# Patient Record
Sex: Female | Born: 1935 | Race: White | Hispanic: No | Marital: Married
Health system: Southern US, Community
[De-identification: ages and names within clinical notes are randomized; demographics above are authoritative.]

---

## 2011-02-09 ENCOUNTER — Emergency Department: Payer: Self-pay | Admitting: Emergency Medicine

## 2011-02-09 LAB — URINALYSIS, COMPLETE
Bacteria: NONE SEEN
Bilirubin,UR: NEGATIVE
Blood: NEGATIVE
Glucose,UR: NEGATIVE mg/dL (ref 0–75)
Nitrite: NEGATIVE
Specific Gravity: 1.021 (ref 1.003–1.030)
Squamous Epithelial: <1

## 2011-02-09 LAB — COMPREHENSIVE METABOLIC PANEL
Albumin: 3.1 g/dL — ABNORMAL LOW (ref 3.4–5.0)
Alkaline Phosphatase: 40 U/L — ABNORMAL LOW (ref 50–136)
Anion Gap: 9 (ref 7–16)
BUN: 11 mg/dL (ref 7–18)
Bilirubin,Total: 0.6 mg/dL (ref 0.2–1.0)
Calcium, Total: 8.6 mg/dL (ref 8.5–10.1)
Co2: 28 mmol/L (ref 21–32)
Creatinine: 0.59 mg/dL — ABNORMAL LOW (ref 0.60–1.30)
Glucose: 101 mg/dL — ABNORMAL HIGH (ref 65–99)
Osmolality: 292 (ref 275–301)
Potassium: 3.9 mmol/L (ref 3.5–5.1)
Sodium: 147 mmol/L — ABNORMAL HIGH (ref 136–145)
Total Protein: 6 g/dL — ABNORMAL LOW (ref 6.4–8.2)

## 2011-02-09 LAB — CBC
HGB: 13.4 g/dL (ref 12.0–16.0)
MCHC: 34 g/dL (ref 32.0–36.0)
Platelet: 120 10*3/uL — ABNORMAL LOW (ref 150–440)
WBC: 6.6 10*3/uL (ref 3.6–11.0)

## 2011-07-12 LAB — URINALYSIS, COMPLETE
Glucose,UR: NEGATIVE mg/dL (ref 0–75)
Protein: NEGATIVE
RBC,UR: 1 /HPF (ref 0–5)
Specific Gravity: 1.019 (ref 1.003–1.030)
Squamous Epithelial: NONE SEEN
WBC UR: 1 /HPF (ref 0–5)

## 2011-07-12 LAB — CBC
HCT: 38.7 % (ref 35.0–47.0)
MCH: 33.7 pg (ref 26.0–34.0)
MCHC: 33.2 g/dL (ref 32.0–36.0)
MCV: 102 fL — ABNORMAL HIGH (ref 80–100)
WBC: 8 10*3/uL (ref 3.6–11.0)

## 2011-07-12 LAB — COMPREHENSIVE METABOLIC PANEL
Albumin: 3 g/dL — ABNORMAL LOW (ref 3.4–5.0)
Anion Gap: 10 (ref 7–16)
Bilirubin,Total: 0.4 mg/dL (ref 0.2–1.0)
Co2: 27 mmol/L (ref 21–32)
EGFR (African American): >60
EGFR (Non-African Amer.): >60
Glucose: 111 mg/dL — ABNORMAL HIGH (ref 65–99)
Potassium: 4.2 mmol/L (ref 3.5–5.1)
SGPT (ALT): 17 U/L
Sodium: 145 mmol/L (ref 136–145)
Total Protein: 5.7 g/dL — ABNORMAL LOW (ref 6.4–8.2)

## 2011-07-12 LAB — TROPONIN I: Troponin-I: 0.02 ng/mL

## 2011-07-13 ENCOUNTER — Observation Stay: Payer: Self-pay | Admitting: Internal Medicine

## 2011-07-15 LAB — BASIC METABOLIC PANEL
Anion Gap: 5 — ABNORMAL LOW (ref 7–16)
BUN: 11 mg/dL (ref 7–18)
Co2: 31 mmol/L (ref 21–32)
Creatinine: 0.63 mg/dL (ref 0.60–1.30)
EGFR (Non-African Amer.): >60
Glucose: 90 mg/dL (ref 65–99)
Sodium: 145 mmol/L (ref 136–145)

## 2011-07-15 LAB — CBC WITH DIFFERENTIAL/PLATELET
Basophil %: 0.3 %
Eosinophil #: 0.1 10*3/uL (ref 0.0–0.7)
Eosinophil %: 2.2 %
HCT: 36.6 % (ref 35.0–47.0)
Lymphocyte #: 1.6 10*3/uL (ref 1.0–3.6)
MCH: 33.5 pg (ref 26.0–34.0)
MCHC: 32.9 g/dL (ref 32.0–36.0)
Monocyte #: 0.5 x10 3/mm (ref 0.2–0.9)
Monocyte %: 9.1 %
Neutrophil #: 3.1 10*3/uL (ref 1.4–6.5)
Platelet: 105 10*3/uL — ABNORMAL LOW (ref 150–440)
RDW: 13.4 % (ref 11.5–14.5)
WBC: 5.3 10*3/uL (ref 3.6–11.0)

## 2012-05-01 LAB — COMPREHENSIVE METABOLIC PANEL
Anion Gap: 5 — ABNORMAL LOW (ref 7–16)
Chloride: 109 mmol/L — ABNORMAL HIGH (ref 98–107)
Creatinine: 0.63 mg/dL (ref 0.60–1.30)
Glucose: 114 mg/dL — ABNORMAL HIGH (ref 65–99)
Potassium: 3.9 mmol/L (ref 3.5–5.1)
Total Protein: 6.7 g/dL (ref 6.4–8.2)

## 2012-05-01 LAB — CK TOTAL AND CKMB (NOT AT ARMC): CK-MB: 1.2 ng/mL (ref 0.5–3.6)

## 2012-05-01 LAB — CBC
HCT: 41.9 % (ref 35.0–47.0)
MCH: 32.5 pg (ref 26.0–34.0)
RBC: 4.26 10*6/uL (ref 3.80–5.20)

## 2012-05-01 LAB — TROPONIN I: Troponin-I: <0.02 ng/mL

## 2012-05-02 ENCOUNTER — Observation Stay: Payer: Self-pay | Admitting: Internal Medicine

## 2012-05-02 LAB — URINALYSIS, COMPLETE
Bacteria: NONE SEEN
Bilirubin,UR: NEGATIVE
Blood: NEGATIVE
Ph: 6 (ref 4.5–8.0)
Squamous Epithelial: <1
WBC UR: 2 /HPF (ref 0–5)

## 2012-05-02 LAB — TROPONIN I: Troponin-I: <0.02 ng/mL

## 2012-05-03 LAB — URINE CULTURE

## 2012-05-04 LAB — CBC WITH DIFFERENTIAL/PLATELET
Eosinophil #: 0.2 10*3/uL (ref 0.0–0.7)
HCT: 36 % (ref 35.0–47.0)
MCH: 33.1 pg (ref 26.0–34.0)
MCHC: 33.7 g/dL (ref 32.0–36.0)
Monocyte #: 0.6 x10 3/mm (ref 0.2–0.9)
Neutrophil %: 52.3 %
Platelet: 112 10*3/uL — ABNORMAL LOW (ref 150–440)
RBC: 3.66 10*6/uL — ABNORMAL LOW (ref 3.80–5.20)

## 2012-05-05 LAB — CBC WITH DIFFERENTIAL/PLATELET
Eosinophil #: 0.2 10*3/uL (ref 0.0–0.7)
Eosinophil %: 2.8 %
HCT: 35.9 % (ref 35.0–47.0)
HGB: 12 g/dL (ref 12.0–16.0)
Lymphocyte #: 1.7 10*3/uL (ref 1.0–3.6)
MCV: 99 fL (ref 80–100)
Monocyte #: 0.6 x10 3/mm (ref 0.2–0.9)
Monocyte %: 10.7 %
Neutrophil %: 55.3 %
Platelet: 114 10*3/uL — ABNORMAL LOW (ref 150–440)
RBC: 3.64 10*6/uL — ABNORMAL LOW (ref 3.80–5.20)
WBC: 5.6 10*3/uL (ref 3.6–11.0)

## 2012-05-05 LAB — BASIC METABOLIC PANEL
Anion Gap: 4 — ABNORMAL LOW (ref 7–16)
BUN: 11 mg/dL (ref 7–18)
Calcium, Total: 8.3 mg/dL — ABNORMAL LOW (ref 8.5–10.1)
Chloride: 111 mmol/L — ABNORMAL HIGH (ref 98–107)
Co2: 29 mmol/L (ref 21–32)
Creatinine: 0.78 mg/dL (ref 0.60–1.30)
Glucose: 108 mg/dL — ABNORMAL HIGH (ref 65–99)
Sodium: 144 mmol/L (ref 136–145)

## 2012-06-25 ENCOUNTER — Encounter: Payer: Self-pay | Admitting: Cardiothoracic Surgery

## 2012-06-25 ENCOUNTER — Encounter: Payer: Self-pay | Admitting: Nurse Practitioner

## 2012-06-28 ENCOUNTER — Encounter: Payer: Self-pay | Admitting: Nurse Practitioner

## 2012-06-28 ENCOUNTER — Encounter: Payer: Self-pay | Admitting: Cardiothoracic Surgery

## 2012-07-19 ENCOUNTER — Emergency Department: Payer: Self-pay | Admitting: Emergency Medicine

## 2012-07-19 LAB — BASIC METABOLIC PANEL
Chloride: 103 mmol/L (ref 98–107)
Co2: 32 mmol/L (ref 21–32)
EGFR (Non-African Amer.): 40 — ABNORMAL LOW
Glucose: 166 mg/dL — ABNORMAL HIGH (ref 65–99)
Osmolality: 285 (ref 275–301)
Sodium: 141 mmol/L (ref 136–145)

## 2012-07-19 LAB — CBC
HGB: 12.7 g/dL (ref 12.0–16.0)
MCH: 32.5 pg (ref 26.0–34.0)
MCHC: 33.4 g/dL (ref 32.0–36.0)
MCV: 97 fL (ref 80–100)
Platelet: 145 10*3/uL — ABNORMAL LOW (ref 150–440)
RDW: 13.3 % (ref 11.5–14.5)

## 2012-07-21 LAB — WOUND CULTURE

## 2012-07-28 ENCOUNTER — Encounter: Payer: Self-pay | Admitting: Cardiothoracic Surgery

## 2012-07-28 ENCOUNTER — Encounter: Payer: Self-pay | Admitting: Nurse Practitioner

## 2012-11-28 DEATH — deceased

## 2013-09-30 IMAGING — CT CT HEAD WITHOUT CONTRAST
2 series · 15 of 30 positions shown, 19 images · non-contrast
Comparison: none

REASON FOR EXAM: ams
COMMENTS:

[Series 2: without · axial · non-contrast · 0.42mm/px · z∈[-130,-10]mm · 13 of 29 slices shown, 17 images]
[im 3/29  brain]
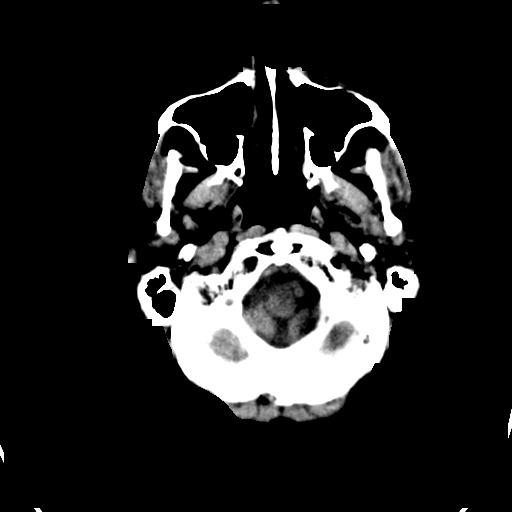
[im 3/29  bone]
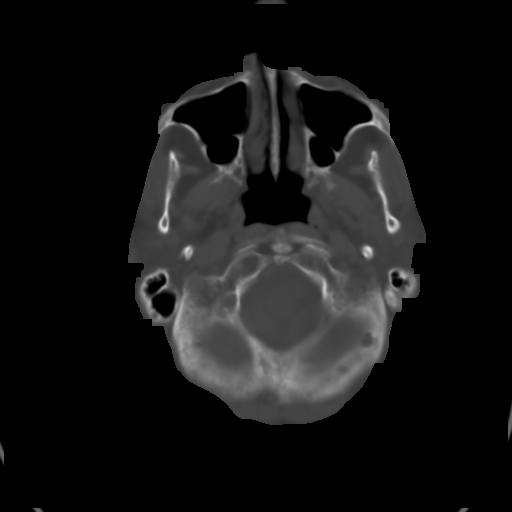
[im 5/29  brain]
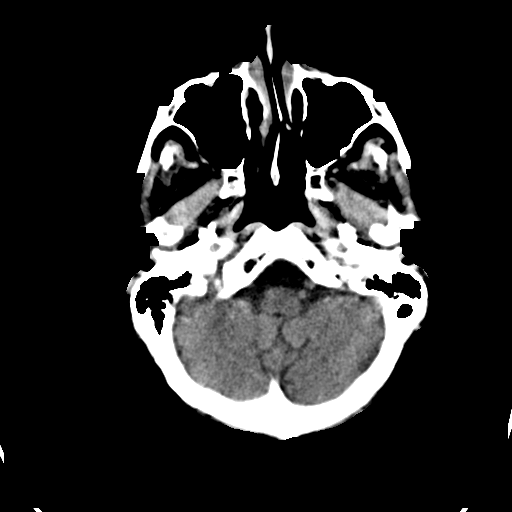
[im 7/29  brain]
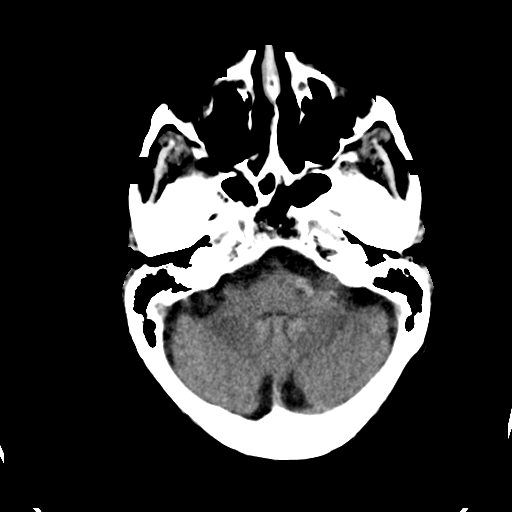
[im 9/29  brain]
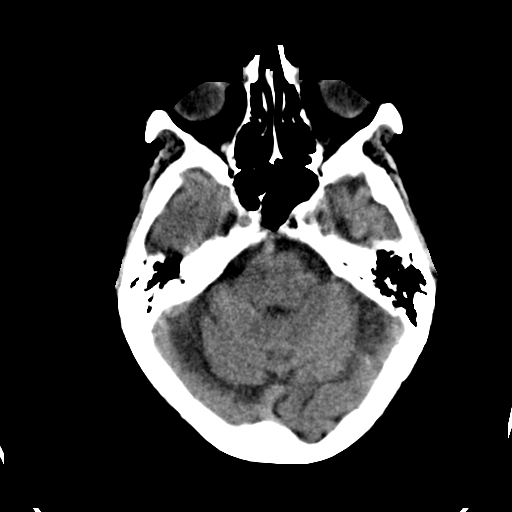
[im 11/29  brain]
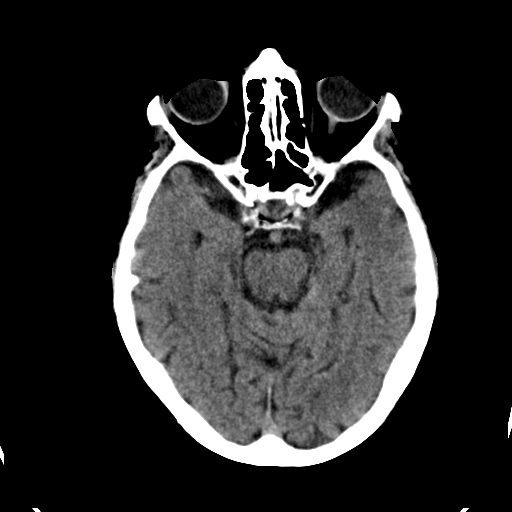
[im 11/29  bone]
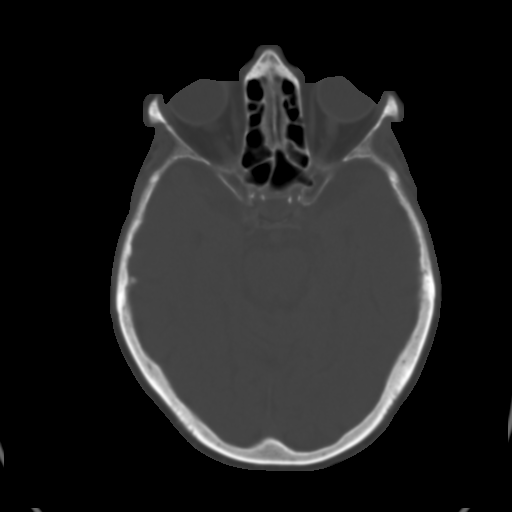
[im 13/29  brain]
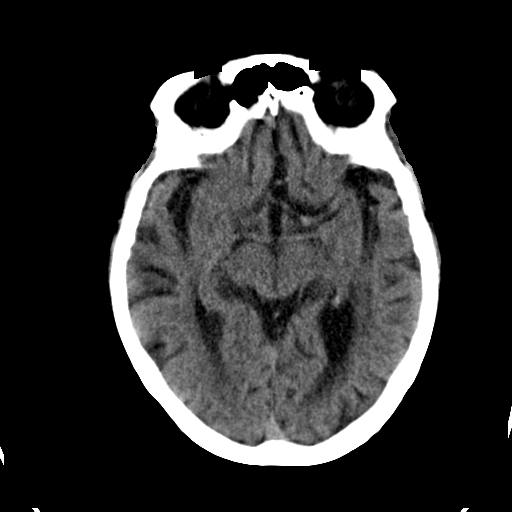
[im 15/29  brain]
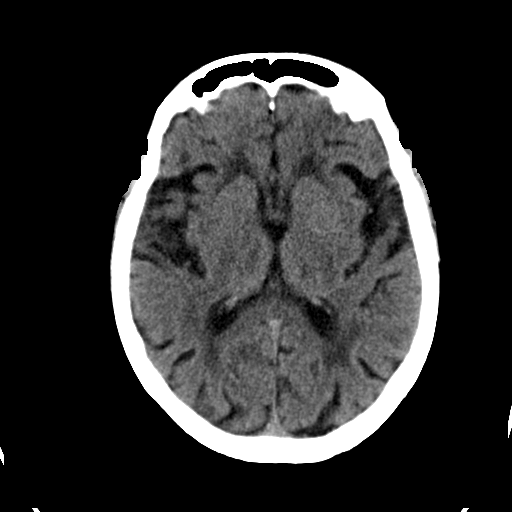
[im 17/29  brain]
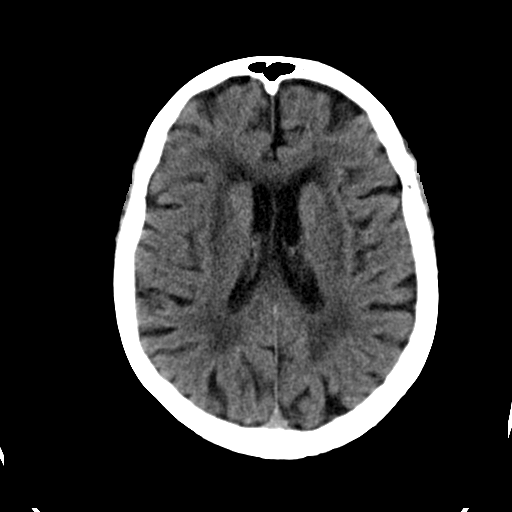
[im 19/29  brain]
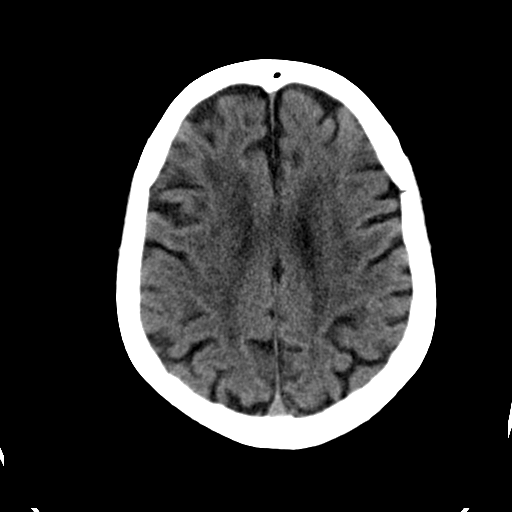
[im 19/29  bone]
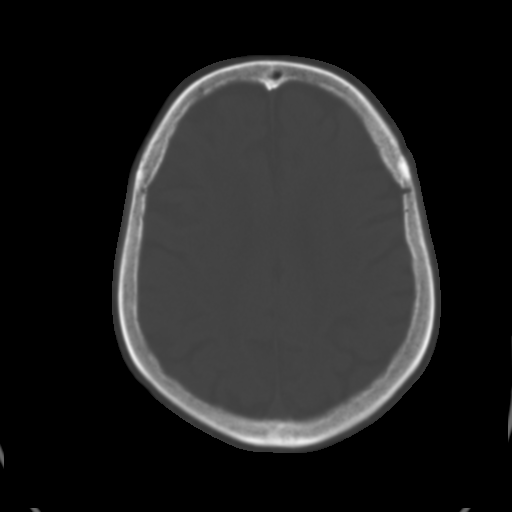
[im 21/29  brain]
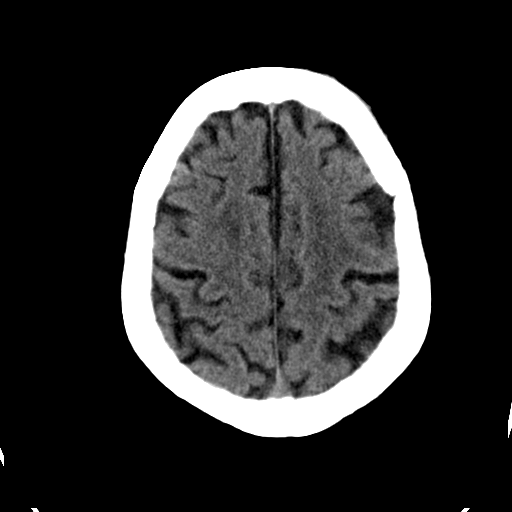
[im 23/29  brain]
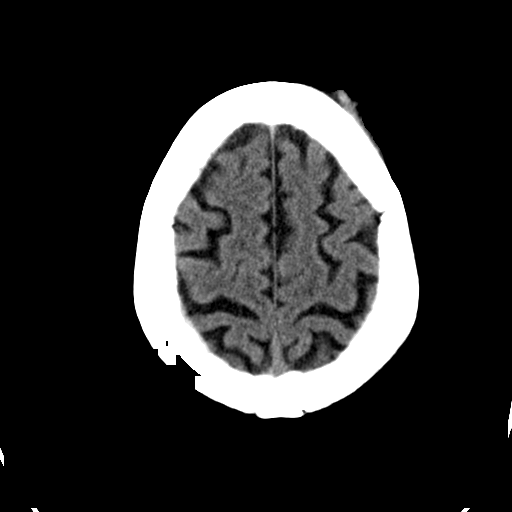
[im 25/29  brain]
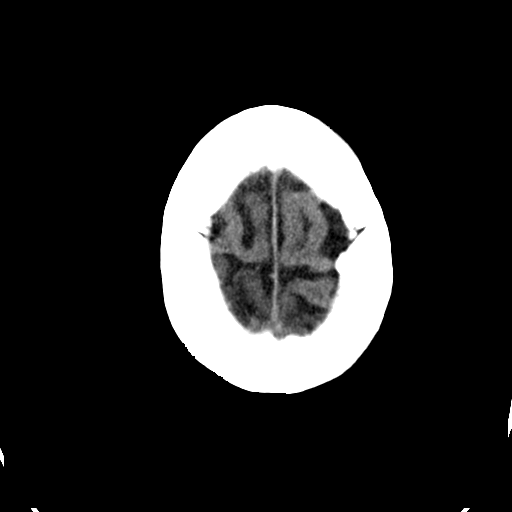
[im 27/29  brain]
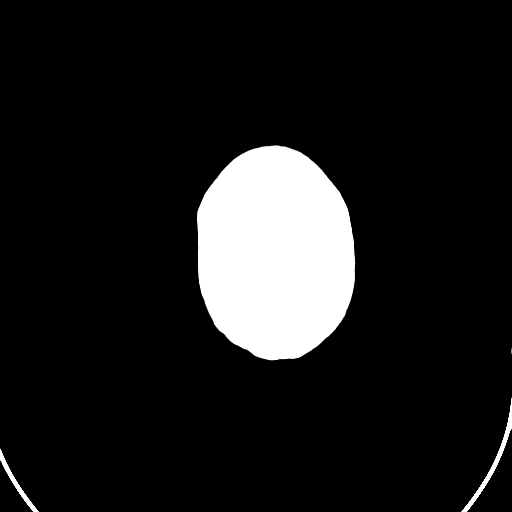
[im 27/29  bone]
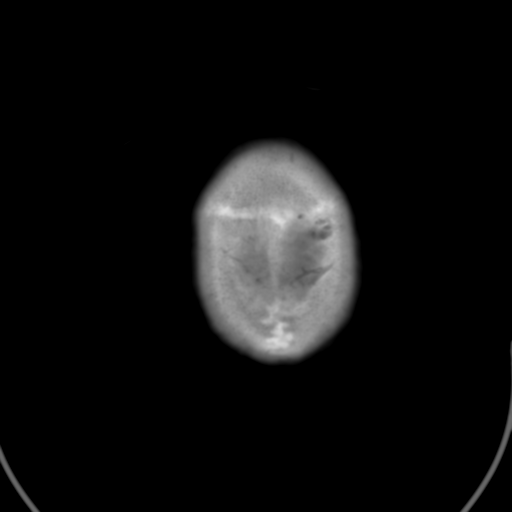

[Series 3: bone · axial · 0.42mm/px · z∈[-130,-110]mm · 2 of 29 slices shown]
[im 3/29  bone]
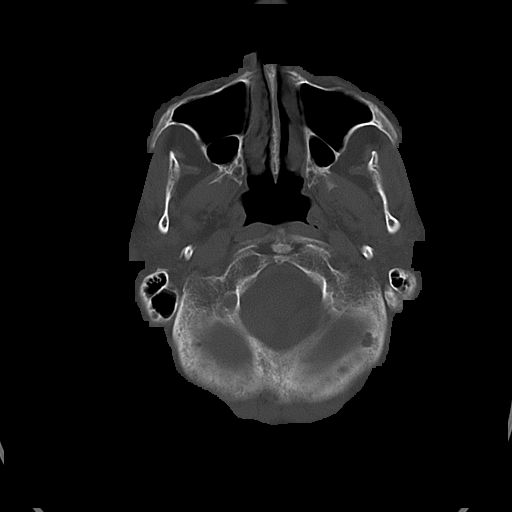
[im 7/29  bone]
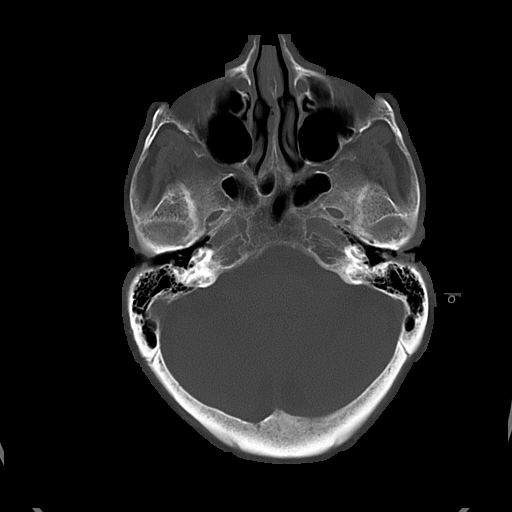

[15 of 30 positions shown; findings below may reference images not displayed]

PROCEDURE:     CT  - CT HEAD WITHOUT CONTRAST  - July 12, 2011  [DATE]

RESULT:     Axial noncontrast CT scanning was performed through the brain
with reconstructions at 5 mm intervals and slice thicknesses. There are no
previous studies for comparison.

There is moderate diffuse cerebral and cerebellar atrophy consistent with
the patient's age. There is no shift of the midline. There is no
ventriculomegaly. There is no evidence of an acute intracranial hemorrhage.
Decreased density in the deep white matter of both cerebral hemispheres is
symmetric and consistent with chronic small vessel ischemic type change. At
bone window settings the observed portions of the paranasal sinuses and
mastoid air cells are clear. There is a cephalohematoma and laceration over
the left frontal region. There is no underlying skull fracture.
IMPRESSION: 1. There are age-related atrophic changes diffusely.
2. I see no evidence of an acute intracranial hemorrhage nor of an evolving
ischemic infarction.
3. There is soft tissue injury over the left frontal bone but I see no
underlying fracture.

[REDACTED]

## 2014-05-20 NOTE — H&P (Signed)
PATIENT NAME:  Briana RoyalsWARD, Briana Moore MR#:  [id number] DATE OF BIRTH:  [birth year]  DATE OF ADMISSION:  05/02/2012  PRIMARY CARE PHYSICIAN:  Dr. Einar CrowMarshall Anderson.   REFERRING PHYSICIAN:  Dr. Dorothea GlassmanPaul Malinda.   CHIEF COMPLAINT:  Shortness of breath.   HISTORY OF PRESENT ILLNESS:  The patient is a 81 year old Caucasian female, was in her usual state of health until the last few hours prior to presentation to the Emergency Department.  She was noticed to have little dry cough, but her concern is that she feels short of breath.  She cannot take a good deep breath.  Denies any chest pain.  No syncope.  No palpitations.  No vomiting.  No diarrhea.  No abdominal pain.  The patient was evaluated at the Emergency Department.  Her chest x-ray was unremarkable except for some chronic changes.  She ended by having CTA of the chest which was negative for pulmonary embolism, however there was a question about collateral veins that are filling and they are within the superior mediastinum.  There was suggestion of extrinsic impression on the left brachiocephalic vein with a questionable if there is a presence of intraluminal thrombus within the superior vena cava that was not excluded.  There was also mild hilar and mediastinal lymphadenopathy.  There was also evidence of chronic interstitial lung disease.  Also noted some mild peribronchial thickening probably reflecting mild bronchitis.  The patient was now in the process to be admitted for further evaluation as an observation.   REVIEW OF SYSTEMS:  CONSTITUTIONAL:  Denies having any fever.  No chills, but she has mild generalized weakness.  EYES:  No blurring of vision.  No double vision.  EARS, NOSE, THROAT:  She has mild hearing impairment that is bilateral and chronic.  No sore throat.  No dysphagia.  CARDIOVASCULAR:  No chest pain, but she has shortness of breath the last few hours.  Mild peripheral edema.  No syncope.  RESPIRATORY:  Mild dry cough.  No hemoptysis.  No  chest pain.  She has shortness of breath as above.  GASTROINTESTINAL:  No abdominal pain, no vomiting, no diarrhea.  GENITOURINARY:  No dysuria, however the family noted that since she was here in the Emergency Department she is urinating more than usual.  The patient denies any dysuria.  MUSCULOSKELETAL:  No joint pain or swelling.  No muscular pain or swelling.  INTEGUMENTARY:  No skin rash.  No ulcers.  NEUROLOGY:  No focal weakness.  No seizure activity.  No headache.  PSYCHIATRY:  No anxiety.  No depression.  ENDOCRINE:  No polyuria or polydipsia.  No heat or cold intolerance.   PAST MEDICAL HISTORY:  Osteoporosis and mild hyperlipidemia.   PAST SURGICAL HISTORY:  Cholecystectomy.   FAMILY HISTORY:  Her father died at age of 79.  He was bedridden at that time.  The cause of his death is unclear.  Her mother suffered from diabetes mellitus.  She does not recall her age at time of her death.   SOCIAL HISTORY:  The patient is widowed.  Lives at home with her daughter.  Her daughter, her name is Merryl HackerWanda Patterson.  She has the power of attorney and she indicated that the patient has a LIVING WILL, and she has DO NOT RESUSCITATE status.   SOCIAL HABITS:  Nonsmoker, but she might have a remote history of mild smoking in the past.  No history of alcohol or other drug abuse.   ADMISSION MEDICATIONS:  Aspirin 81 mg a  day and Evista 60 mg once a day.   ALLERGIES:  CODEINE CAUSING GI SIDE EFFECTS AND FLAGYL.  PHYSICAL EXAMINATION: VITAL SIGNS:  Blood pressure 120/78, respiratory rate 18, pulse 78, oxygen saturation 98%.  GENERAL APPEARANCE:  Elderly female, thin-looking, lying in bed in no acute distress.  HEAD AND NECK:  No pallor.  No icterus.  No cyanosis.  EARS, NOSE, THROAT:  Ear examination revealed mild hearing impairment, no discharge, no ulcers.  Examination of the nose showed no discharge, no bleeding.  Oropharyngeal examination revealed no oral thrush, no exudate.  No ulcers.  EYES:   Revealed the patient had lost all the eyelashes.  There is some scarring in both eyelids.  Normal conjunctivae.  Pupils about 5 mm, equal, sluggishly reactive to light.  NECK:  Supple.  Trachea at midline.  No masses.  No thyromegaly.  HEART:  Normal S1, S2.  No S3, S4.  No murmur.  No gallop.  No carotid bruits.  RESPIRATORY:  Revealed normal breathing pattern without use of accessory muscles.  No rales.  No wheezing.  ABDOMEN:  Soft without tenderness.  No hepatosplenomegaly.  No masses.  No hernias.  SKIN:  Revealed no ulcers.  No subcutaneous nodules.  MUSCULOSKELETAL:  No joint swelling.  No clubbing.  NEUROLOGIC:  Cranial nerves II through XII are intact.  No focal motor deficit.  PSYCHIATRIC:  The patient is alert, oriented to place and people.  Mood and affect were flat.   LABORATORY FINDINGS:  Chest x-ray showed chronic lung changes.  No consolidation.  No effusion.  CTA of the chest showed chronic interstitial lung disease findings.  Some atelectasis of the dependent areas.  There is filling of collateral veins within the superior mediastinum, suggestion of extrinsic impression on the left brachiocephalic vein, presence of intraluminal thrombus within the SVC or superior vena cavus, difficult to exclude.  There was mild hilar and mediastinal lymphadenopathy.  Also noted mild peribronchial thickening, may suggest bronchitis.  EKG showed normal sinus rhythm at rate of 75 per minute.  Left bundle branch block.  Serum glucose 114.  B-type natriuretic peptide (BNP) was 493, BUN 12, creatinine 0.6, sodium 143, potassium 3.9.  Albumin 3.2, AST 40, ALT 24, bilirubin 0.4.  Troponin less than 0.02 x 2 samples.  CBC showed a white count of 7000, hemoglobin 13, hematocrit 41, platelet count 144.  D-dimer 1.26.  Urinalysis showed cloudy urine, but 1 RBC, and 2 white blood cells.  Nitrite negative.   ASSESSMENT: 1.  Shortness of breath.  The only finding we have by work-up is acute bronchitis.  However,  there is CT findings as below.  2.  CTA of chest showed abnormal finding of filling of collateral veins within the superior mediastinum suggestion of extrinsic impression on the left brachiocephalic vein.  Thrombus within the superior vena cava was not excluded.  3.  Chronic lung interstitial markings, may suggest a chronic interstitial disease.  4.  Mild hypercholesterolemia.  5.  Osteoporosis.  6.  DO NOT RESUSCITATE status.   PLAN:  We will admit the patient for observation.  Oxygen supplementation.  Nebulization treatment with DuoNebs.  I will start antibiotic using Levaquin.  Consult vascular surgery for evaluation of the CTA findings.  For deep vein thrombosis prophylaxis, we will place the patient on subcutaneous heparin 5000 twice a day.  The patient does have a LIVING WILL and her CODE STATUS is DO NOT RESUSCITATE.  The daughter who has the power of attorney stated that  in the morning she will try to bring a copy to be placed in her chart.   TIME SPENT IN EVALUATING THIS PATIENT:  Took more than 55 minutes.    ____________________________ Carney Corners. Rudene Re, MD amd:ea D: 05/02/2012 04:43:30 ET T: 05/02/2012 06:39:05 ET JOB#: [id number]  cc: Carney Corners. Rudene Re, MD, <Dictator> Karolee Ohs Dala Dock MD ELECTRONICALLY SIGNED 05/03/2012 2:09

## 2014-05-20 NOTE — Discharge Summary (Signed)
PATIENT NAME:  Briana Moore, Briana MR#:  [id number] DATE OF BIRTH:  [birth year]  DATE OF ADMISSION:  05/02/2012 DATE OF DISCHARGE:  05/05/2012  DISCHARGE DIAGNOSES: 1.  Encephalopathy, unclear cause.  2.  Bronchitis.  Negative chest x-rays for pneumonia.  3.  Weakness, improving somewhat and is ambulating in the room.  DISCHARGE MEDICATIONS: 1.  Evista 60 mg daily. 2.  Aspirin 81 mg daily.   HISTORY AND PHYSICAL: Please see detailed history and physical done on admission.   HOSPITAL COURSE: The patient was admitted coughing, confused, weak and complaining of shortness of breath. CT of her chest was negative for PE.  Chest x-rays were negative for pneumonia. CT was negative for that as well. She was given Levaquin while here.  Cough did slowly improve. She was given Flonase as well. She had some urinary frequency. Urinalyses were completely normal. She was afebrile.  Again was given 4 days worth of Levaquin. The family wished her to return home with home health. Did not want her to go to rehab presently. She has some anxiety and poor sleep at night. Improved with 1 dose of Haldol last night. Hopefully she will not require anymore going forward. If her urinary frequency continues, may have home health repeat urine culture. Might consider bladder relaxants, although given propensity to cause confusion in the extreme elderly, such as she is, will try to avoid that if possible. ____________________________ Marya AmslerMarshall W. Dareen PianoAnderson, MD mwa:sb D: 05/05/2012 07:54:01 ET T: 05/05/2012 08:17:53 ET JOB#: [id number]  cc: Marya AmslerMarshall W. Dareen PianoAnderson, MD, <Dictator> Lauro RegulusMARSHALL W ANDERSON MD ELECTRONICALLY SIGNED 05/06/2012 7:44

## 2014-05-20 NOTE — Consult Note (Signed)
General Aspect stricture of left subclavian vein   Present Illness The patient is a 81 year old female, was in her usual state of health until yesterday when she experienced abrupt onset of SOB and subsequently presentation to the Emergency Department.  She was noted to have a cough.  Denies any chest pain.  No syncope.  No palpitations.  No vomiting.  No diarrhea.  No abdominal pain.  Her chest x-ray was unremarkable except for some chronic changes.  She had a CTA of the chest which was negative for pulmonary embolism, however there was a question about collateral veins that are filling and abnormality of the left innominate and subclavian veins.  Today the patient feels beter and denies SOB.  No left arm pain or swelling at present or in the past.  Past Medical Hx hyperlipidemia osteoporosis mild COPD   Home Medications: Medication Instructions Status  Evista 60 mg oral tablet 1 tab(s) orally once a day Active  aspirin 81 mg oral tablet 1 tab(s) orally once a day Active    Codeine: GI Distress  Flagyl: Unknown  Case History:  Family History Non-Contributory   Social History negative tobacco, negative ETOH, negative Illicit drugs   Review of Systems:  Fever/Chills No   Cough Yes   Sputum No   Abdominal Pain No   Diarrhea No   Constipation No   Nausea/Vomiting No   SOB/DOE Yes   Chest Pain No   Telemetry Reviewed NSR   Tolerating Diet Yes   Physical Exam:  GEN well developed, well nourished, thin, frail   HEENT PERRL, hearing intact to voice, dry oral mucosa   NECK supple  trachea midline   RESP normal resp effort  no use of accessory muscles   CARD regular rate  no JVD   ABD denies tenderness  nondistended   EXTR negative cyanosis/clubbing, negative edema, arms are symetric with good pulses bilaterally   SKIN positive rashes, positive ulcers, multiple ecchymosis noted, skin tear right forearm   NEURO cranial nerves intact, motor/sensory function  intact   PSYCH alert, good insight   Nursing/Ancillary Notes: **Vital Signs.:   05-Apr-14 05:57  Vital Signs Type Admission  Temperature Temperature (F) 98.2  Celsius 36.7  Temperature Source oral  Pulse Pulse 83  Respirations Respirations 20  Systolic BP Systolic BP 372  Diastolic BP (mmHg) Diastolic BP (mmHg) 71  Mean BP 90  Pulse Ox % Pulse Ox % 95  Pulse Ox Activity Level  At rest  Oxygen Delivery Room Air/ 21 %   Hepatic:  04-Apr-14 23:28   Bilirubin, Total 0.4  Alkaline Phosphatase 69  SGPT (ALT) 24  SGOT (AST)  40  Total Protein, Serum 6.7  Albumin, Serum  3.2  Routine Chem:  04-Apr-14 23:28   B-Type Natriuretic Peptide ([facility])  493 (Result(s) reported on 02 May 2012 at 12:00AM.)  Glucose, Serum  114  BUN 12  Creatinine (comp) 0.63  Sodium, Serum 143  Potassium, Serum 3.9  Chloride, Serum  109  CO2, Serum 29  Calcium (Total), Serum 8.6  Osmolality (calc) 286  eGFR (African American) >60  eGFR (Non-African American) >60 (eGFR values <53m/min/1.73 m2 may be an indication of chronic kidney disease (CKD). Calculated eGFR is useful in patients with stable renal function. The eGFR calculation will not be reliable in acutely ill patients when serum creatinine is changing rapidly. It is not useful in  patients on dialysis. The eGFR calculation may not be applicable to patients at the low and  high extremes of body sizes, pregnant women, and vegetarians.)  Anion Gap  5  Cardiac:  04-Apr-14 23:28   Troponin I < 0.02 (0.00-0.05 0.05 ng/mL or less: NEGATIVE  Repeat testing in 3-6 hrs  if clinically indicated. >0.05 ng/mL: POTENTIAL  MYOCARDIAL INJURY. Repeat  testing in 3-6 hrs if  clinically indicated. NOTE: An increase or decrease  of 30% or more on serial  testing suggests a  clinically important change)  CK, Total 40  CPK-MB, Serum 1.2 (Result(s) reported on 01 May 2012 at 11:54PM.)  Routine Coag:  04-Apr-14 23:28   D-Dimer, Quantitative  1.26 (If the  D-dimer test is being used to assist in the exclusion of DVT and/or PE, note the following:  In various studies concerning the D-dimer methodology (STA Liatest) in use by this laboratory, it has been reported that with a cut-off value of 0.50 ug/mL FEU, the  negative predictive value regarding the exclusion of thrombosis is within the 95-100% range.  In patients with high pre-test probability of DVT/PE the results of the D-dimer test should be correlated with other diagnostic and clinical assessment modalities." Reference: [employer]., 2005.)  Routine Hem:  04-Apr-14 23:28   WBC (CBC) 7.7  RBC (CBC) 4.26  Hemoglobin (CBC) 13.8  Hematocrit (CBC) 41.9  Platelet Count (CBC)  144 (Result(s) reported on 01 May 2012 at 11:49PM.)  MCV 98  MCH 32.5  MCHC 33.0  RDW 13.5   CT:    05-Apr-14 01:48, CT Chest for Pulm Embolism With Contrast  CT Chest for Pulm Embolism With Contrast   REASON FOR EXAM:    onset of sob tonight has never had before hi ddimer,   cxrt seems to show dec vasc  COMMENTS:       PROCEDURE: CT  - CT CHEST (FOR PE) W  - May 02 2012  1:48AM     RESULT: Axial CT scanning was performed through the chest with   reconstructions at 3 mm intervals and slice thicknesses. The patient   received 75 cc of Isovue-370 intravenously. Review of multiplanar   reconstructed images was performed separately on the VIA monitor.   Comparison is made to a chest x-ray dated May 01, 2012.    Contrast within the pulmonary arterial tree is normal in appearance.   There are no filling defects to suggest an acute pulmonary embolism. The   caliber of the thoracic aorta is normal. The cardiac chambers are top     normal in size. There is no pleural nor pericardial effusion.    At lung window settings there are emphysematous changes bilaterally. Mild   compressive atelectasis is noted posteriorly in both lungs. A few   scattered subcentimeter nodules are demonstrated bilaterally. The    thoracic vertebral bodies are preserved in height. Within the upper   abdomen there is gas within the biliary tree consistent with previous   sphincterotomy. The observed portions of the liver and pancreas and   adrenal glands exhibit no acute abnormalities.    IMPRESSION:   1. There is no evidence of an acute pulmonary embolism.  2. There are emphysematous changes in both lungs.  3. There is no alveolar pneumonia. There are scattered nodules in both   lungs.  4. There is mild compression of the left brachiocephalic vein between the   posterior surface of the sternum and the aortic root. I do not see   intraluminal thrombus within it. There is filling of collateral veins.  A preliminary report was sent to the emergency department at the   conclusion of the study.     Dictation Site: 1        Verified By: DAVID A. Martinique, M.D., MD    Impression 1.  Stricture/Stenosis of the left innominate/subclavian vein.  CTA of chest showed abnormal filling of the innominate with extensive collateral veins.  There is the suggestion of extrinsic compression to a severe degree on the left brachiocephalic vein by the aortic arch and the sternum.  Thrombus within the superior vena cava is not definatively identified and there is no evidence of PE. the collateral suggest a long standing chronic process especially given teh asymptomatic nature of the patients arm.  Dorris Singh this in association with her age and the bruising already noted emperic Coumadin therap is Not recommended.  Daughter is present and agrees. 2.  Shortness of breath.  continue treatemtn for acute bronchitis.   3.  Chronic lung interstitial markings, may suggest a chronic interstitial disease.  4.  Mild hypercholesterolemia.  5.  Osteoporosis.   Plan level 4   Electronic Signatures: Hortencia Pilar (MD)  (Signed 05-Apr-14 13:33)  Authored: General Aspect/Present Illness, Home Medications, Allergies, History and Physical Exam,  Vital Signs, Labs, Radiology, Impression/Plan   Last Updated: 05-Apr-14 13:33 by Hortencia Pilar (MD)

## 2014-05-22 NOTE — Discharge Summary (Signed)
PATIENT NAME:  Briana RoyalsWARD, Briana MR#:  [id number] DATE OF BIRTH:  Nov 03, 1912  DATE OF ADMISSION:  07/13/2011 DATE OF DISCHARGE:  07/15/2011  NOTE: She was actually never admitted, she was placed in observation status.   DISCHARGE DIAGNOSIS: She was placed in observation after a fall and ambulatory dysfunction.   HOSPITAL COURSE: Patient was placed in observation after a fall waiting on physical therapy to ensure she was okay to ambulate. She had some nausea when she was up and around and was delayed in doing so, otherwise she did well without any arrhythmias, etc. I saw her the day  of discharge, noted she had an elevated MCV so a B12 level was done, was not back till she was discharged. However, was 101, which was markedly low. Will have daughters who are nurses give her B12 at that point.   ____________________________ Marya AmslerMarshall W. Dareen PianoAnderson, MD mwa:cms D: 07/17/2011 16:41:07 ET T: 07/17/2011 17:08:14 ET JOB#: [id number]  cc: Marya AmslerMarshall W. Dareen PianoAnderson, MD, <Dictator> Lauro RegulusMARSHALL W Ramsie Ostrander MD ELECTRONICALLY SIGNED 07/19/2011 14:14

## 2014-05-22 NOTE — H&P (Signed)
PATIENT NAME:  TANE, BIEGLER MR#:  [id number] DATE OF BIRTH:  06/20/1912  DATE OF ADMISSION:  07/13/2011  PRIMARY CARE PHYSICIAN: Einar Crow, MD   ER REFERRING PHYSICIAN: Daryel November, MD    ADMITTING PHYSICIAN: Gaynelle Cage, MD   PRESENTING COMPLAINT: Left knee pain.   HISTORY OF PRESENT ILLNESS: The patient is 81 year old lady who was in her usual state of health until this afternoon when she was found on the floor by family members out in the yard. The patient apparently had gone to pick up some pine cones and slipped and fell. She bruised her left knee, her right arm and hand, and sustained a laceration about 2 cm long on the left frontal region of the scalp. For this she was brought to the Emergency Room. Work-up so far has been negative with negative CT head, negative X-ray of the knee. The patient did complain of severe pain in the knee and has been unable to walk since  presenting to the hospital. Currently she is on pain medication but is having difficulty walking, for which she was referred to the hospitalist for further evaluation. She denies any chest pain, PND, orthopnea, or pedal edema. No palpitations. No nausea, vomiting, or loss of consciousness. She says she slipped on some object outside and sustained a mechanical fall. No loss of consciousness, ENT bleed or any incontinence.   PAST MEDICAL HISTORY: History of hyperlipidemia. previously on statin, but this was discontinued about one month ago after she was noted to have low cholesterol levels.   PAST SURGICAL HISTORY: None.  SOCIAL HISTORY: She lives alone. Her daughter lives next door to her. No alcohol, tobacco, or recreational drug use. The patient is able to complete her activities of daily living.   FAMILY HISTORY: Reviewed and noncontributory.   ALLERGIES: Codeine, which gives her GI upset, and Flagyl, which causes unknown reaction.  MEDICATIONS: Only Tramadol which she takes once a day for pain.   REVIEW  OF SYSTEMS: CONSTITUTIONAL: Denies any fever. Admits to fatigue and weakness and pain in the left knee, but no weight loss or weight gain. EYES: No blurred vision, redness or discharge. ENT: No tinnitus, mild impaired hearing. No epistaxis, redness of oropharynx, or difficulty swallowing. RESPIRATORY: Denies any cough, wheezing. CARDIOVASCULAR: Denies any chest pain, orthopnea, pedal edema. No arrhythmia. No syncope. GI: No nausea, vomiting, diarrhea, abdominal pain. No change in bowel habits. GU: No dysuria, frequency, or incontinence. ENDOCRINE: No polyuria, polydipsia, heat or cold intolerance. HEMATOLOGIC: No anemia, easy bruising, or swollen glands. SKIN: Has abrasion on the right hand, left forearm, laceration on the left frontal area of the scalp, and a light abrasion on the left knee. MUSCULOSKELETAL: Having pain in the left knee and right hand. NEUROLOGICAL: No numbness, tremors, vertigo, dementia or memory loss. PSYCHIATRIC: No anxiety or depression.   PHYSICAL EXAMINATION:  VITAL SIGNS: Temperature 97.2, pulse 96, respiratory rate 20, blood pressure 144/70, saturations of 97% on room air.   GENERAL: A frail-looking elderly lady lying on a gurney, awake, alert, oriented to time, place, and person, in no obvious distress. Family at bedside, supportive.   HEENT: Normocephalic. Pupils are equal and reactive to light and accommodation. Extraocular movement is intact. Mucous membranes pink, moist.   NECK: Supple. No JV distention. The patient has a 2 cm laceration on the left frontal surface of the scalp with mild erythema around it.   CHEST: Good air entry. Clear to auscultation.   HEART: Regular rate and rhythm. No murmurs.  ABDOMEN: Full, moves with respiration, nontender. Bowel sound are normoactive. No organomegaly.   EXTREMITIES: No edema, clubbing, deformity.   NEUROLOGIC: Cranial nerves II to III grossly intact. No focal motor or sensory deficits. Able to move all limbs except for  left knee which she is nursing due to pain in the left knee.  SKIN: Has abrasion on the right hand on the thenar eminence of the hand, a laceration 2 cm length on the left frontal area of the scalp and a large abrasion of the left knee which has been bandaged up in the ER.   LABORATORY, DIAGNOSTIC AND RADIOLOGICAL DATA:  X-ray for view of the knee showed no fractures.  CT head showed no acute intracranial abnormality but showed soft tissue injury in left frontal area of the scalp and age-related atrophy.  CBC: White count 8, hemoglobin 13, platelets 107, down from 120 from six months ago.  Chemistry: Sodium 145, potassium 4.2, creatinine 0.6, BUN 16, glucose 111, calcium 8.5. Normal LFT. Troponin negative. Urinalysis is negative.   IMPRESSION:  1. Ambulatory dysfunction secondary to intractable pain. 2. Mechanical fall with soft tissue injuries.  3. History of hyperlipidemia, stable.   PLAN:  1. Admit to general medical floor under observation for pain control.  2. Physical Therapy evaluation in a.m.  3. GI prophylaxis with Protonix.  4. Deep vein thrombosis prophylaxis with subcutaneous heparin.   CODE STATUS: FULL CODE.   TOTAL PATIENT CARE TIME: 50 minutes.   ____________________________ Floy SabinaMarcel I. Tilda FrancoAkuneme, MD mia:cbb D: 07/13/2011 00:48:53 ET T: 07/13/2011 08:30:26 ET JOB#: [id number]  cc: Christol Thetford I. Tilda FrancoAkuneme, MD, <Dictator> Marya AmslerMarshall W. Dareen PianoAnderson, MD Margaret PyleMARCEL I Bassy Fetterly MD ELECTRONICALLY SIGNED 07/18/2011 2:22

## 2014-07-24 IMAGING — CR DG CHEST 2V
1 series · 3 of 3 positions shown · non-contrast
Comparison: none

REASON FOR EXAM: dypsnea; cough
COMMENTS:

PROCEDURE:     DXR - DXR CHEST PA (OR AP) AND LATERAL  - May 04, 2012  [DATE]
RESULT:     Comparison: 05/01/2012

[Series 1: ap · 0.17mm/px · 3 of 3 slices shown]
[im 1/3]
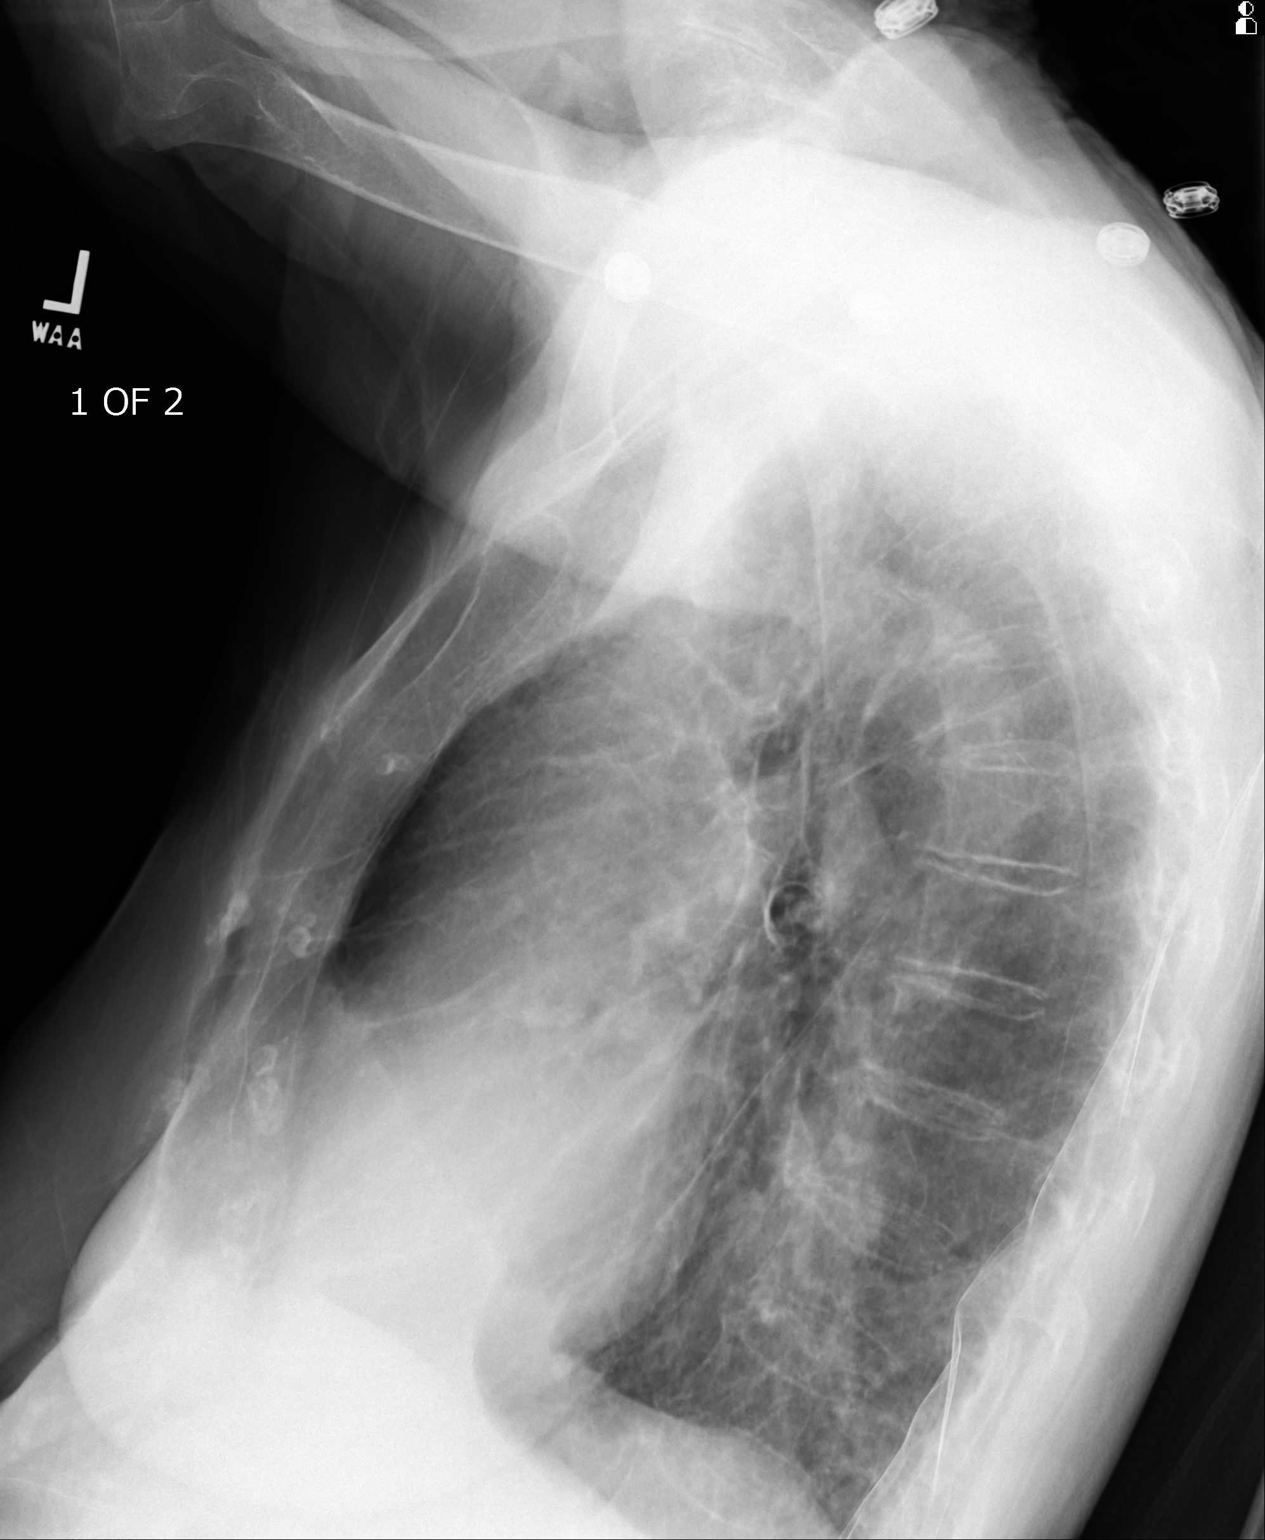
[im 2/3]
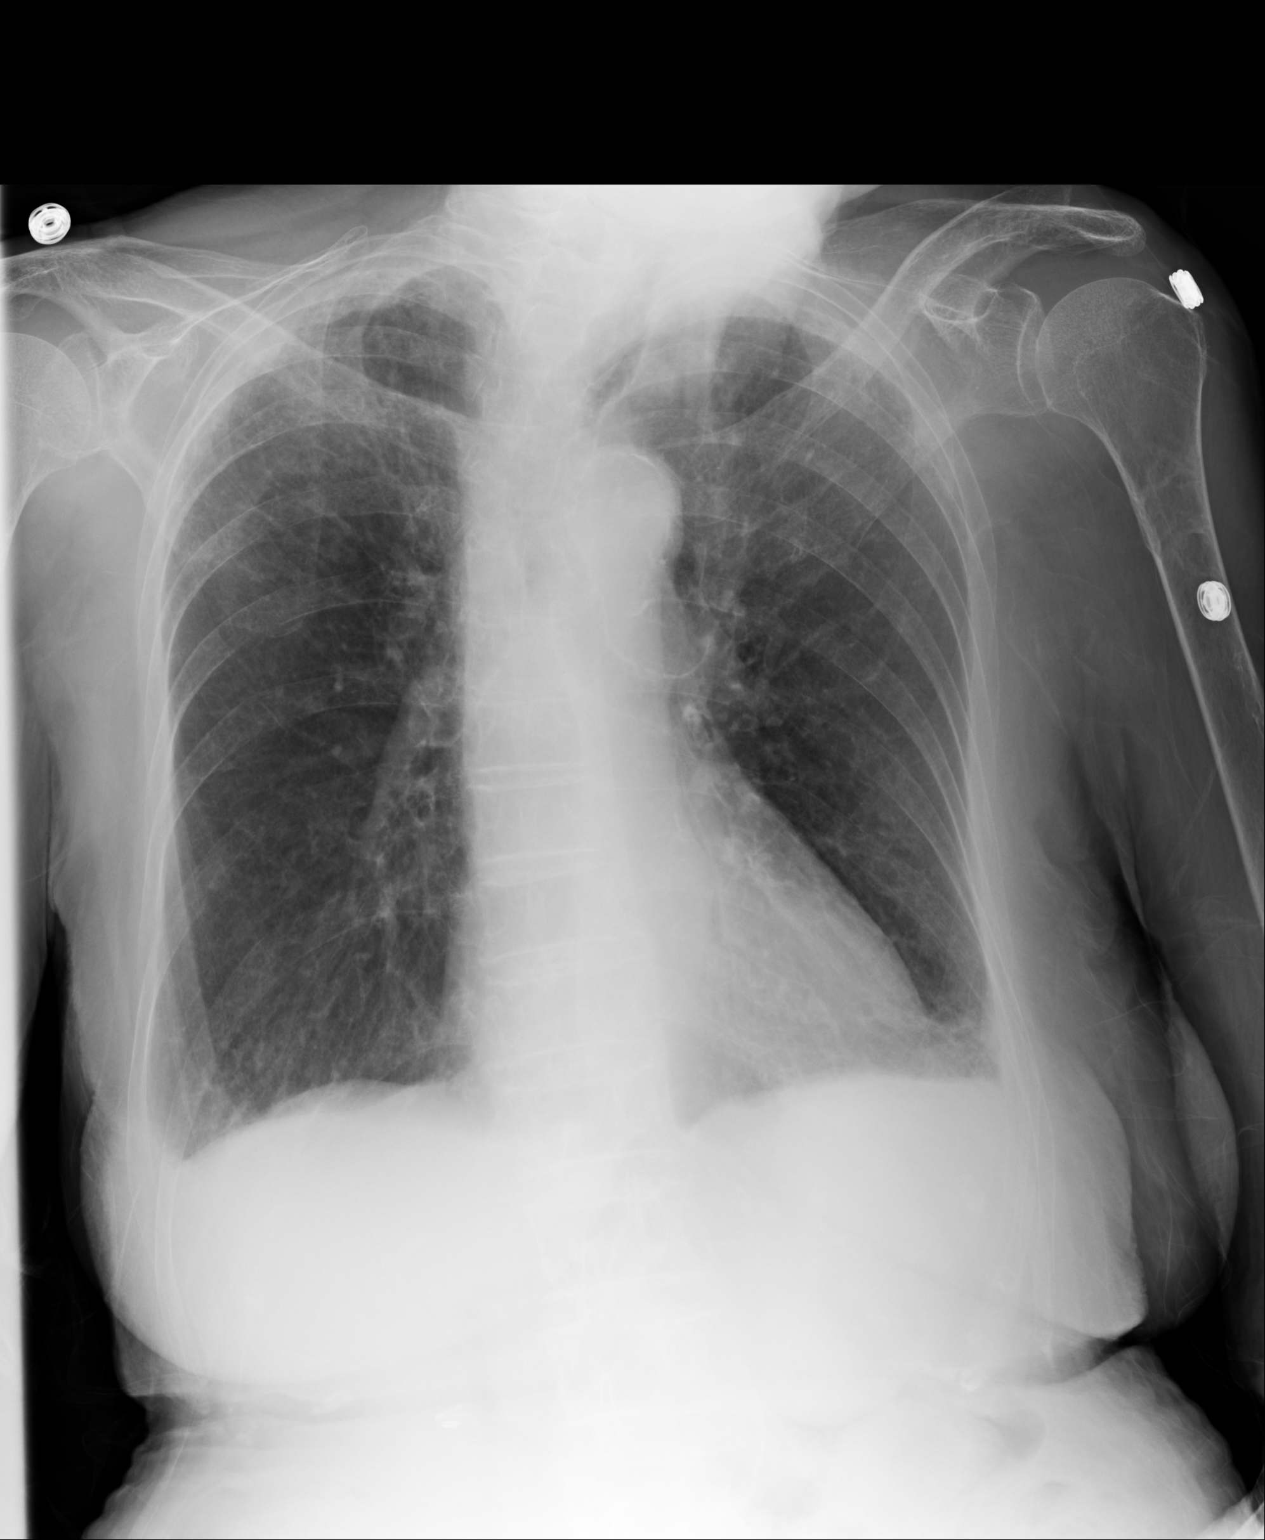
[im 3/3]
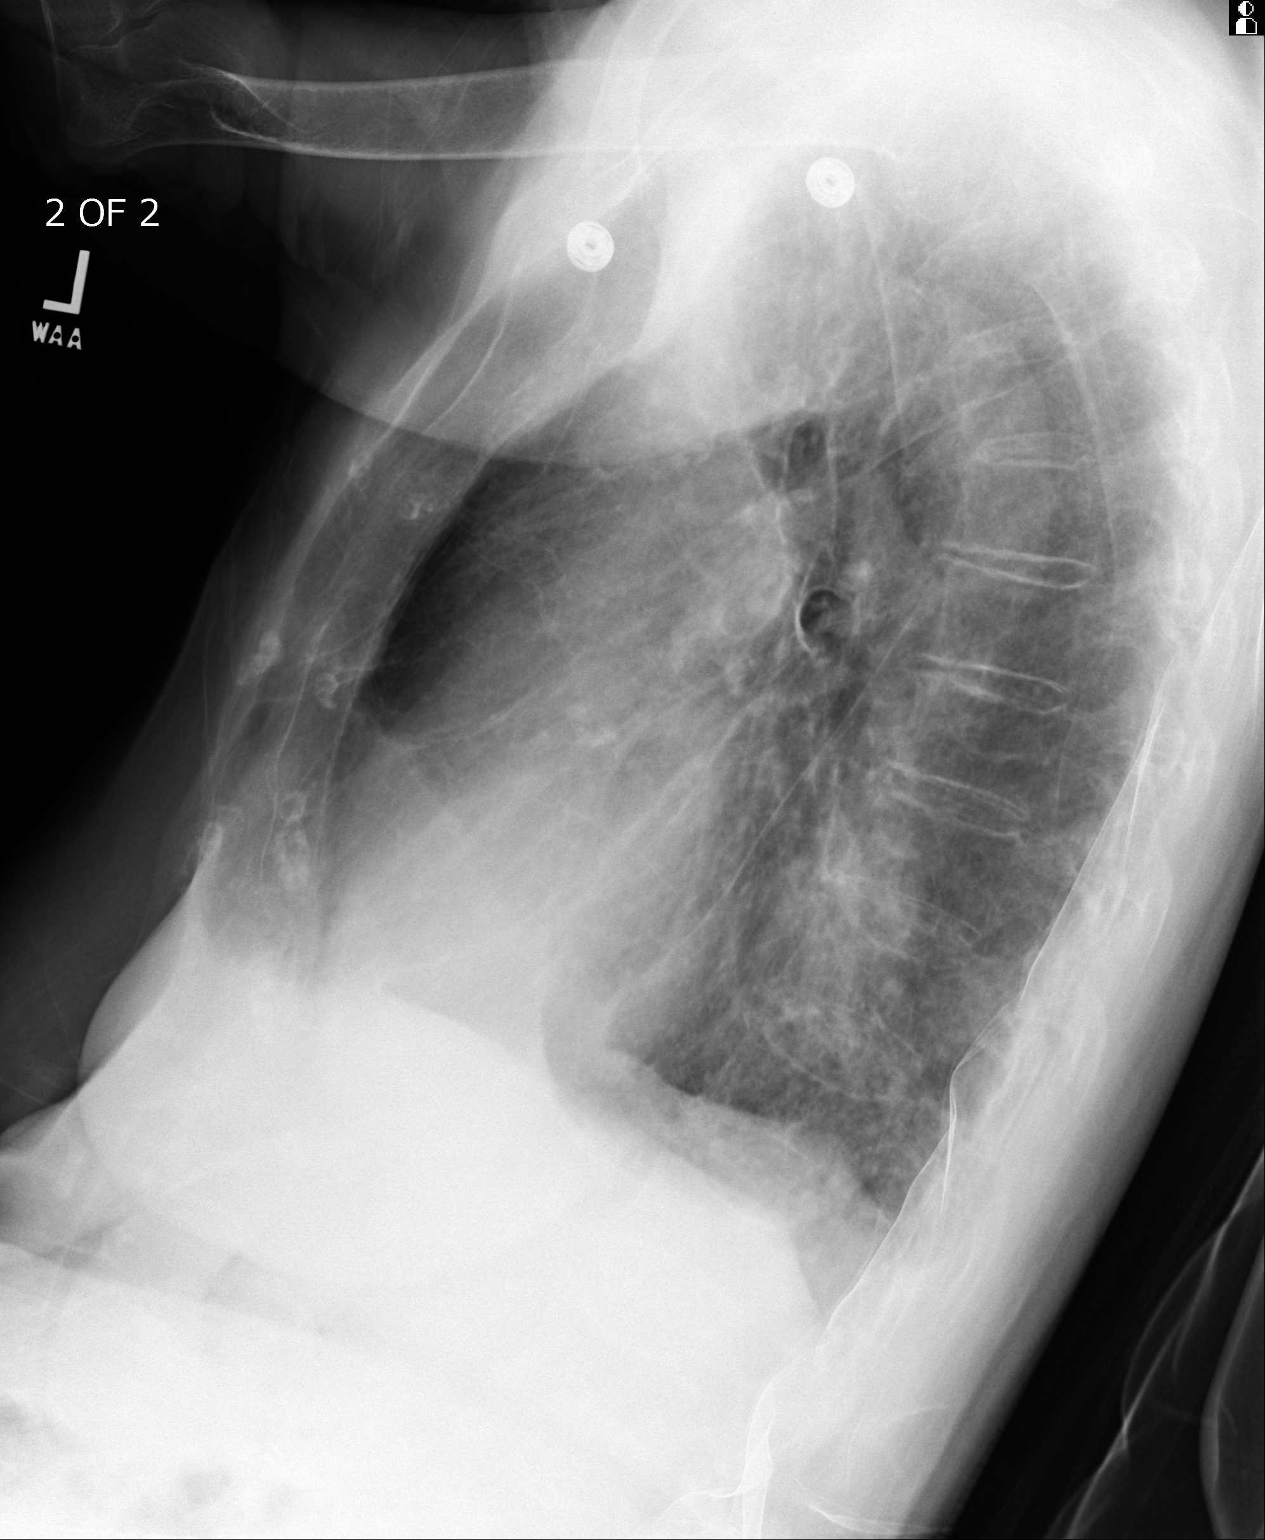

[3 of 3 positions shown; findings below may reference images not displayed]

FINDINGS: The heart and mediastinum are stable, given patient rotation. The neck soft
tissue overlies the left lung apex. There is biapical pleural-parenchymal
thickening, which is similar to prior. Minimal reticular opacities at the
lung bases are likely secondary to subsegmental atelectasis. The lungs are
hyperinflated.
IMPRESSION: Findings of COPD. Minimal basilar subsegmental atelectasis.

[REDACTED]

## 2014-10-08 IMAGING — CR DG CHEST 2V
1 series · 2 of 2 positions shown · non-contrast
Comparison: none

REASON FOR EXAM: SOB
COMMENTS:

PROCEDURE:     DXR - DXR CHEST PA (OR AP) AND LATERAL  - July 19, 2012  [DATE]
RESULT:

[Series 1: x chest ap · 0.14mm/px · 2 of 2 slices shown]
[im 1/2]
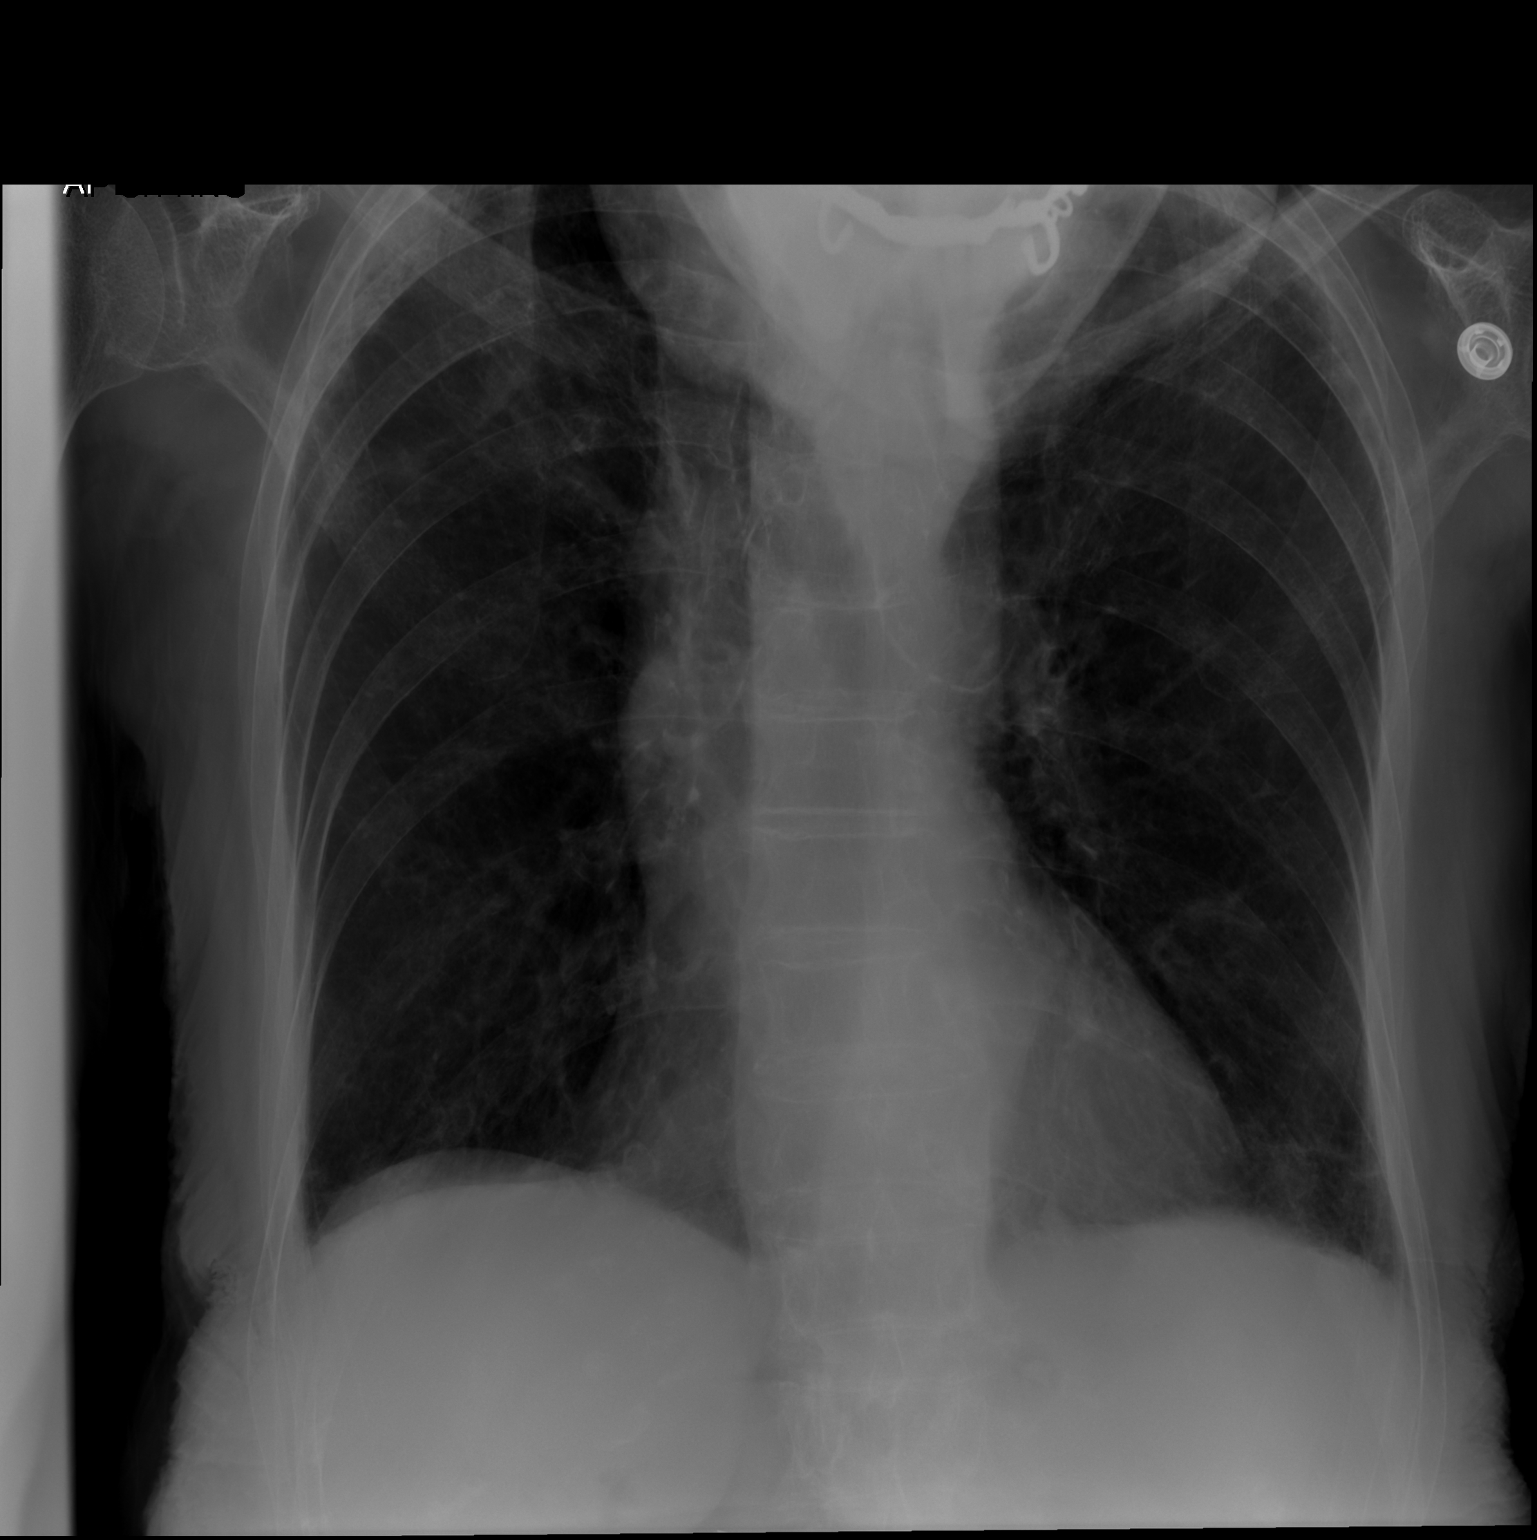
[im 2/2]
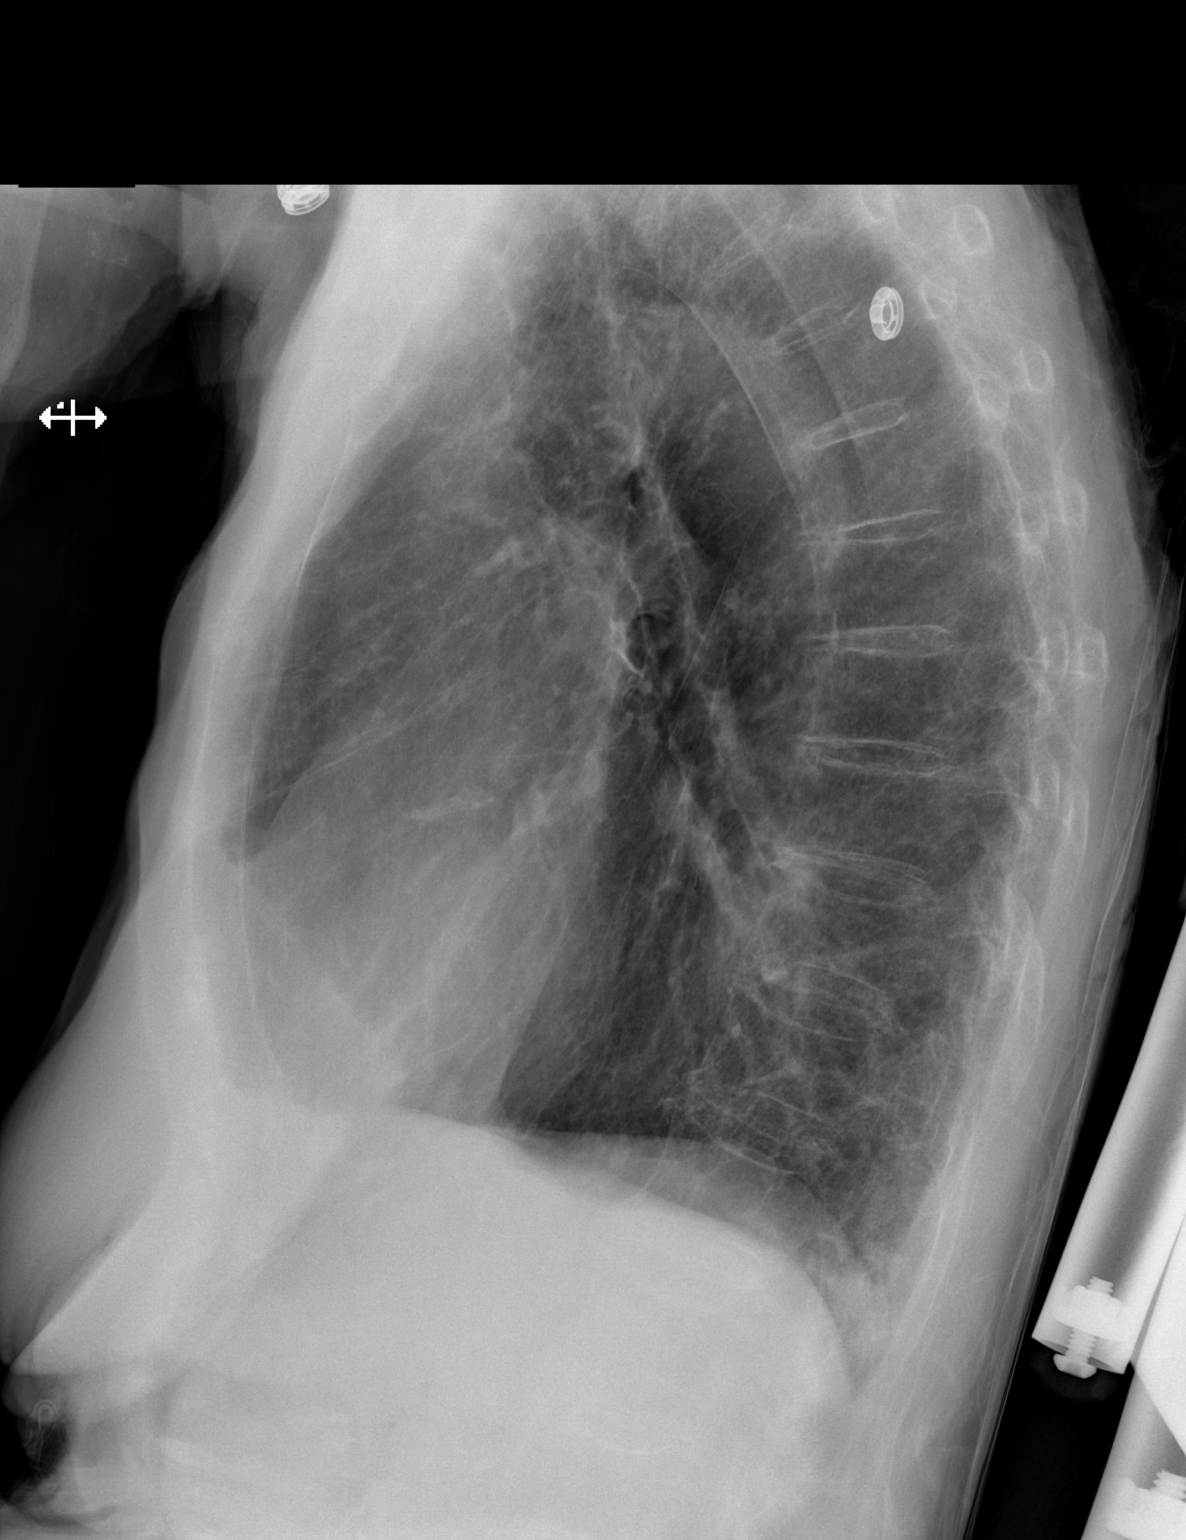

[2 of 2 positions shown; findings below may reference images not displayed]

FINDINGS: The lungs are hyperinflated. There are areas likely representing
biapical pleural thickening. Otherwise, no focal regions of consolidation
are identified. Mild area of increased density projects in the region of the
lingula. This may represent atelectasis though mild infiltrate cannot be
excluded. The cardiac silhouette is within the upper limits of normal. The
visualized bony skeleton is unremarkable.
IMPRESSION: 1.  COPD.
2.  Mild area of increased density in the region of the lingula.
Differential considerations are atelectasis versus mild infiltrate.
